# Patient Record
Sex: Male | Born: 1990 | Race: Black or African American | Hispanic: No | Marital: Single | Smoking: Never smoker
Health system: Southern US, Community
[De-identification: ages and names within clinical notes are randomized; demographics above are authoritative.]

---
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
## 2011-07-14 LAB — DIFFERENTIAL
Basophils Absolute: 0.1
Eosinophils Absolute: 0.2
Eosinophils Relative: 4
Lymphs Abs: 2
Monocytes Absolute: 0.7
Neutrophils Relative %: 37 — ABNORMAL LOW

## 2011-07-14 LAB — CBC
HCT: 40.1
Hemoglobin: 13.1
MCV: 69.7 — ABNORMAL LOW
Platelets: 292
RDW: 13.2

## 2011-07-14 LAB — COMPREHENSIVE METABOLIC PANEL
Albumin: 3.9
CO2: 29
Chloride: 100
Creatinine, Ser: 1.02
GFR calc Af Amer: >60
Glucose, Bld: 97
Sodium: 136

## 2011-07-14 LAB — OCCULT BLOOD X 1 CARD TO LAB, STOOL: Fecal Occult Bld: NEGATIVE

## 2011-07-14 LAB — URINALYSIS, ROUTINE W REFLEX MICROSCOPIC
Glucose, UA: NEGATIVE
Ketones, ur: NEGATIVE
Nitrite: NEGATIVE
Protein, ur: NEGATIVE

## 2011-12-16 ENCOUNTER — Emergency Department (HOSPITAL_COMMUNITY)
Admission: EM | Admit: 2011-12-16 | Discharge: 2011-12-16 | Disposition: A | Discharge status: Home Health | Payer: Self-pay | Attending: Emergency Medicine | Admitting: Emergency Medicine

## 2011-12-16 ENCOUNTER — Emergency Department (HOSPITAL_COMMUNITY): Payer: Self-pay

## 2011-12-16 ENCOUNTER — Encounter (HOSPITAL_COMMUNITY): Payer: Self-pay | Admitting: Adult Health

## 2011-12-16 DIAGNOSIS — M545 Low back pain, unspecified: Secondary | ICD-10-CM | POA: Insufficient documentation

## 2011-12-16 DIAGNOSIS — M25519 Pain in unspecified shoulder: Secondary | ICD-10-CM | POA: Insufficient documentation

## 2011-12-16 MED ORDER — TRAMADOL HCL 50 MG PO TABS: 50.0000 mg | ORAL_TABLET | Freq: Four times a day (QID) | ORAL | Status: AC | PRN

## 2011-12-16 NOTE — ED Provider Notes (Signed)
History     CSN: [id number]  Arrival date & time 12/16/11  1437   None     Chief Complaint  Patient presents with  . Shoulder Pain  . Back Pain    (Consider location/radiation/quality/duration/timing/severity/associated sxs/prior treatment) HPI  Pt presents to the ED with complaints of constant right shoulder for three weeks. 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 He normally lifts weights and has not been able to do so. The patient also complains of back pain that hurts when he stands up and sits down. Once he walks he is fine and this pain has not kept him from doing any activities of daily living or activities that he enjoys. He denies weakness, loss of sensation or urinary/bowel incontinence.   History reviewed. No pertinent past medical history.  History reviewed. No pertinent past surgical history.  History reviewed. No pertinent family history.  History  Substance Use Topics  . Smoking status: Never Smoker   . Smokeless tobacco: Not on file  . Alcohol Use: No      Review of Systems  All other systems reviewed and are negative.    Allergies  Review of patient's allergies indicates no known allergies.  Home Medications   Current Outpatient Rx  Name Route Sig Dispense Refill  . IBUPROFEN 200 MG PO TABS Oral Take 400 mg by mouth every 6 (six) hours as needed. pain    . TRAMADOL HCL 50 MG PO TABS Oral Take 1 tablet (50 mg total) by mouth every 6 (six) hours as needed for pain. 15 tablet 0    There were no vitals taken for this visit.  Physical Exam  Nursing note and vitals reviewed. Constitutional: He appears well-developed and well-nourished. No distress.  HENT:  Head: Normocephalic and atraumatic.  Eyes: Pupils are equal, round, and reactive to light.  Neck: Normal range of motion. Neck supple.  Cardiovascular: Normal rate and regular rhythm.   Pulmonary/Chest: Effort normal.  Abdominal: Soft.  Musculoskeletal:     Right shoulder: He exhibits tenderness. He exhibits normal range of motion, no bony tenderness, no swelling, no effusion, no crepitus, no deformity, no laceration, normal pulse and normal strength.       Lumbar back: He exhibits tenderness and spasm. He exhibits normal range of motion, no bony tenderness, no swelling, no edema, no deformity, no laceration, no pain and normal pulse.  Neurological: He is alert.  Skin: Skin is warm and dry.    ED Course  Procedures (including critical care time)  Labs Reviewed - No data to display Dg Shoulder Right  12/16/2011  *RADIOLOGY REPORT*  Clinical Data: 21 year old male with right shoulder pain.  No known injury.  RIGHT SHOULDER - 2+ VIEW  Comparison: None  Findings: There is no evidence of fracture or dislocation. On the internal rotation view, there appears to be mild inferior subluxation of the humeral head. No focal bony abnormalities are otherwise noted. The visualized right hemithorax is unremarkable.  IMPRESSION: Question mild inferior subluxation of the humeral head on internal rotation which could represent instability.  No other significant abnormalities identified.  Original Report Authenticated By: Rosendo Gros, M.D.     1. Shoulder pain   2. Low back pain       MDM  Pt xrays shows some mild findings that suggest possible instability. Will refer patient to Ortho and give Rx for Ultram for pain. I will also place patient in sling for shoulder  rest.        Dorthula Matas, PA 12/16/11 1550

## 2011-12-16 NOTE — Discharge Instructions (Signed)
Acromioclavicular Injuries  The AC (acromioclavicular) joint is the joint in the shoulder where the collarbone (clavicle) meets the shoulder blade (scapula). The part of the shoulder blade connected to the collarbone is called the acromion. Common problems with and treatments for the AC joint are detailed below.  ARTHRITIS  Arthritis occurs when the joint has been injured and the smooth padding between the joints (cartilage) is lost. This is the wear and tear seen in most joints of the body if they have been overused. This causes the joint to produce pain and swelling which is worse with activity.   AC JOINT SEPARATION  AC joint separation means that the ligaments connecting the acromion of the shoulder blade and collarbone have been damaged, and the two bones no longer line up. AC separations can be anywhere from mild to severe, and are "graded" depending upon which ligaments are torn and how badly they are torn.   Grade I Injury: the least damage is done, and the AC joint still lines up.   Grade II Injury: damage to the ligaments which reinforce the AC joint. In a Grade II injury, these ligaments are stretched but not entirely torn. When stressed, the AC joint becomes painful and unstable.   Grade III Injury: AC and secondary ligaments are completely torn, and the collarbone is no longer attached to the shoulder blade. This results in deformity; a prominence of the end of the clavicle.  AC JOINT FRACTURE  AC joint fracture means that there has been a break in the bones of the AC joint, usually the end of the clavicle.  TREATMENT  TREATMENT OF AC ARTHRITIS   There is currently no way to replace the cartilage damaged by arthritis. The best way to improve the condition is to decrease the activities which aggravate the problem. Application of ice to the joint helps decrease pain and soreness (inflammation). The use of non-steroidal anti-inflammatory medication is helpful.   If less conservative measures do not  work, then cortisone shots (injections) may be used. These are anti-inflammatories; they decrease the soreness in the joint and swelling.   If non-surgical measures fail, surgery may be recommended. The procedure is generally removal of a portion of the end of the clavicle. This is the part of the collarbone closest to your acromion which is stabilized with ligaments to the acromion of the shoulder blade. This surgery may be performed using a tube-like instrument with a light (arthroscope) for looking into a joint. It may also be performed as an open surgery through a small incision by the surgeon. Most patients will have good range of motion within 6 weeks and may return to all activity including sports by 8-12 weeks, barring complications.  TREATMENT OF AN AC SEPARATION   The initial treatment is to decrease pain. This is best accomplished by immobilizing the arm in a sling and placing an ice pack to the shoulder for 20 to 30 minutes every 2 hours as needed. As the pain starts to subside, it is important to begin moving the fingers, wrist, elbow and eventually the shoulder in order to prevent a stiff or "frozen" shoulder. Instruction on when and how much to move the shoulder will be provided by your caregiver. The length of time needed to regain full motion and function depends on the amount or grade of the injury. Recovery from a Grade I AC separation usually takes 10 to 14 days, whereas a Grade III may take 6 to 8 weeks.   Grade   III injuries usually allow return to full activity with few restrictions. Treatment is also based on the activity demands of the injured shoulder. For example, a high level quarterback with an injured throwing arm will receive more aggressive treatment than someone with a desk job who rarely uses his/her arm for strenuous activities. In some cases, a painful lump may persist which could require a later surgery.  Surgery can be very successful, but the benefits must be weighed against the potential risks.  TREATMENT OF AN AC JOINT FRACTURE Fracture treatment depends on the type of fracture. Sometimes a splint or sling may be all that is required. Other times surgery may be required for repair. This is more frequently the case when the ligaments supporting the clavicle are completely torn. Your caregiver will help you with these decisions and together you can decide what will be the best treatment. HOME CARE INSTRUCTIONS   Apply ice to the injury for 15 to 20 minutes each hour while awake for 2 days. Put the ice in a plastic bag and place a towel between the bag of ice and skin.   If a sling has been applied, wear it constantly for as long as directed by your caregiver, even at night. The sling or splint can be removed for bathing or showering or as directed. Be sure to keep the shoulder in the same place as when the sling is on. Do not lift the arm.   If a figure-of-eight splint has been applied it should be tightened gently by another person every day. Tighten it enough to keep the shoulders held back. Allow enough room to place the index finger between the body and strap. Loosen the splint immediately if there is numbness or tingling in the hands.   Take over-the-counter or prescription medicines for pain, discomfort or fever as directed by your caregiver.   If you or your child has received a follow up appointment, it is very important to keep that appointment in order to avoid long term complications, chronic pain or disability.  SEEK MEDICAL CARE IF:   The pain is not relieved with medications.   There is increased swelling or discoloration that continues to get worse rather than better.   You or your child has been unable to follow up as instructed.   There is progressive numbness and tingling in the arm, forearm or hand.  SEEK IMMEDIATE MEDICAL CARE IF:   The arm is numb, cold or pale.    There is increasing pain in the hand, forearm or fingers.  MAKE SURE YOU:   Understand these instructions.   Will watch your condition.   Will get help right away if you are not doing well or get worse.  Document Released: 07/19/2005 Document Revised: 06/21/2011 Document Reviewed: 01/11/2009 [facility] Patient Information 2012 [place], [place].Back Exercises Back exercises help treat and prevent back injuries. The goal of back exercises is to increase the strength of your abdominal and back muscles and the flexibility of your back. These exercises should be started when you no longer have back pain. Back exercises include:  Pelvic Tilt. Lie on your back with your knees bent. Tilt your pelvis until the lower part of your back is against the floor. Hold this position 5 to 10 sec and repeat 5 to 10 times.   Knee to Chest. Pull first 1 knee up against your chest and hold for 20 to 30 seconds, repeat this with the other knee, and then both knees. This may  be done with the other leg straight or bent, whichever feels better.   Sit-Ups or Curl-Ups. Bend your knees 90 degrees. Start with tilting your pelvis, and do a partial, slow sit-up, lifting your trunk only 30 to 45 degrees off the floor. Take at least 2 to 3 seconds for each sit-up. Do not do sit-ups with your knees out straight. If partial sit-ups are difficult, simply do the above but with only tightening your abdominal muscles and holding it as directed.   Hip-Lift. Lie on your back with your knees flexed 90 degrees. Push down with your feet and shoulders as you raise your hips a couple inches off the floor; hold for 10 seconds, repeat 5 to 10 times.   Back arches. Lie on your stomach, propping yourself up on bent elbows. Slowly press on your hands, causing an arch in your low back. Repeat 3 to 5 times. Any initial stiffness and discomfort should lessen with repetition over time.   Shoulder-Lifts. Lie face down with arms beside your body. Keep  hips and torso pressed to floor as you slowly lift your head and shoulders off the floor.  Do not overdo your exercises, especially in the beginning. Exercises may cause you some mild back discomfort which lasts for a few minutes; however, if the pain is more severe, or lasts for more than 15 minutes, do not continue exercises until you see your caregiver. Improvement with exercise therapy for back problems is slow.  See your caregivers for assistance with developing a proper back exercise program. Document Released: 11/16/2004 Document Revised: 06/07/2011 Document Reviewed: 10/09/2005 [facility] Patient Information 2012 [place], [place].Back Pain, Adult Low back pain is very common. About 1 in 5 people have back pain.The cause of low back pain is rarely dangerous. The pain often gets better over time.About half of people with a sudden onset of back pain feel better in just 2 weeks. About 8 in 10 people feel better by 6 weeks.  CAUSES Some common causes of back pain include:  Strain of the muscles or ligaments supporting the spine.   Wear and tear (degeneration) of the spinal discs.   Arthritis.   Direct injury to the back.  DIAGNOSIS Most of the time, the direct cause of low back pain is not known.However, back pain can be treated effectively even when the exact cause of the pain is unknown.Answering your caregiver's questions about your overall health and symptoms is one of the most accurate ways to make sure the cause of your pain is not dangerous. If your caregiver needs more information, he or she may order lab work or imaging tests (X-rays or MRIs).However, even if imaging tests show changes in your back, this usually does not require surgery. HOME CARE INSTRUCTIONS For many people, back pain returns.Since low back pain is rarely dangerous, it is often a condition that people can learn to [facility] their own.   Remain active. It is stressful on the back to sit or stand in one place. Do  not sit, drive, or stand in one place for more than 30 minutes at a time. Take short walks on level surfaces as soon as pain allows.Try to increase the length of time you walk each day.   Do not stay in bed.Resting more than 1 or 2 days can delay your recovery.   Do not avoid exercise or work.Your body is made to move.It is not dangerous to be active, even though your back may hurt.Your back will likely heal faster  if you return to being active before your pain is gone.   Pay attention to your body when you bend and lift. Many people have less discomfortwhen lifting if they bend their knees, keep the load close to their bodies,and avoid twisting. Often, the most comfortable positions are those that put less stress on your recovering back.   Find a comfortable position to sleep. Use a firm mattress and lie on your side with your knees slightly bent. If you lie on your back, put a pillow under your knees.   Only take over-the-counter or prescription medicines as directed by your caregiver. Over-the-counter medicines to reduce pain and inflammation are often the most helpful.Your caregiver may prescribe muscle relaxant drugs.These medicines help dull your pain so you can more quickly return to your normal activities and healthy exercise.   Put ice on the injured area.   Put ice in a plastic bag.   Place a towel between your skin and the bag.   Leave the ice on for 15 to 20 minutes, 3 to 4 times a day for the first 2 to 3 days. After that, ice and heat may be alternated to reduce pain and spasms.   Ask your caregiver about trying back exercises and gentle massage. This may be of some benefit.   Avoid feeling anxious or stressed.Stress increases muscle tension and can worsen back pain.It is important to recognize when you are anxious or stressed and learn ways to manage it.Exercise is a great option.  SEEK MEDICAL CARE IF:  You have pain that is not relieved with rest or medicine.     You have pain that does not improve in 1 week.   You have new symptoms.   You are generally not feeling well.  SEEK IMMEDIATE MEDICAL CARE IF:   You have pain that radiates from your back into your legs.   You develop new bowel or bladder control problems.   You have unusual weakness or numbness in your arms or legs.   You develop nausea or vomiting.   You develop abdominal pain.   You feel faint.  Document Released: 10/09/2005 Document Revised: 06/21/2011 Document Reviewed: 02/27/2011 [facility] Patient Information 2012 [place], [place].

## 2011-12-16 NOTE — ED Notes (Signed)
C/o one monjth of constant right shoulder pain and 3 weeks of lower back pain that is worse with standing and sitting.

## 2011-12-16 NOTE — ED Provider Notes (Signed)
Medical screening examination/treatment/procedure(s) were performed by non-physician practitioner and as supervising physician I was immediately available for consultation/collaboration.  Ciin Brazzel R. Tahj Lindseth, MD 12/16/11 1613 

## 2012-05-21 ENCOUNTER — Emergency Department (HOSPITAL_COMMUNITY)
Admission: EM | Admit: 2012-05-21 | Discharge: 2012-05-21 | Disposition: A | Discharge status: Home / Self Care | Payer: Self-pay | Attending: Emergency Medicine | Admitting: Emergency Medicine

## 2012-05-21 ENCOUNTER — Encounter (HOSPITAL_COMMUNITY): Payer: Self-pay | Admitting: Emergency Medicine

## 2012-05-21 ENCOUNTER — Emergency Department (HOSPITAL_COMMUNITY): Payer: Self-pay

## 2012-05-21 DIAGNOSIS — S4980XA Other specified injuries of shoulder and upper arm, unspecified arm, initial encounter: Secondary | ICD-10-CM | POA: Insufficient documentation

## 2012-05-21 DIAGNOSIS — S4992XA Unspecified injury of left shoulder and upper arm, initial encounter: Secondary | ICD-10-CM

## 2012-05-21 DIAGNOSIS — W19XXXA Unspecified fall, initial encounter: Secondary | ICD-10-CM | POA: Insufficient documentation

## 2012-05-21 DIAGNOSIS — Y9361 Activity, american tackle football: Secondary | ICD-10-CM | POA: Insufficient documentation

## 2012-05-21 DIAGNOSIS — S46909A Unspecified injury of unspecified muscle, fascia and tendon at shoulder and upper arm level, unspecified arm, initial encounter: Secondary | ICD-10-CM | POA: Insufficient documentation

## 2012-05-21 MED ORDER — MORPHINE SULFATE 4 MG/ML IJ SOLN
4.0000 mg | Freq: Once | INTRAMUSCULAR | Status: AC
  Administered 2012-05-21: 4 mg via INTRAVENOUS
  Filled 2012-05-21: qty 1

## 2012-05-21 MED ORDER — SODIUM CHLORIDE 0.9 % IV SOLN
Freq: Once | INTRAVENOUS | Status: AC
  Administered 2012-05-21: 14:00:00 via INTRAVENOUS

## 2012-05-21 MED ORDER — OXYCODONE-ACETAMINOPHEN 5-325 MG PO TABS: 1.0000 | ORAL_TABLET | Freq: Four times a day (QID) | ORAL | Status: AC | PRN

## 2012-05-21 MED ORDER — HYDROMORPHONE HCL PF 1 MG/ML IJ SOLN
1.0000 mg | Freq: Once | INTRAMUSCULAR | Status: AC
  Administered 2012-05-21: 1 mg via INTRAVENOUS
  Filled 2012-05-21: qty 1

## 2012-05-21 MED ORDER — OXYCODONE-ACETAMINOPHEN 5-325 MG PO TABS
2.0000 | ORAL_TABLET | Freq: Once | ORAL | Status: AC
  Administered 2012-05-21: 2 via ORAL
  Filled 2012-05-21: qty 2

## 2012-05-21 NOTE — ED Notes (Signed)
Pt c/o of left should injury after playing football today. States that left should is painful and tender. Pain is 10/10

## 2012-05-21 NOTE — ED Notes (Signed)
Patient transported to X-ray 

## 2012-05-21 NOTE — Progress Notes (Signed)
CM spoke with pt who confirms self pay [facility] resident with no pcp. Discussed the importance of a pcp for f/u. Reviewed Health connect number to assist with finding self pay provider close to pt's residence. Reviewed resources for [facility], general medial clinics, medications-[email or web address], housing, DSS, health Department and other resources in [employer] including crisis programs Pt voiced understanding and appreciation of resources provided

## 2012-05-21 NOTE — ED Provider Notes (Signed)
History     CSN: [id number]  Arrival date & time 05/21/12  1305   First MD Initiated Contact with Patient 05/21/12 1404      Chief Complaint  Patient presents with  . LT shoulder injury     (Consider location/radiation/quality/duration/timing/severity/associated sxs/prior treatment) The history is provided by the patient.  Healthy 21 y/o M presents with c/c left shoulder injury just PTA today. Was playing football, fell to the ground on the shoulder. Has been unable to perform much movement since that time. Pain severe, constant, worse with attempted movement or palpation of the area. Non-radiating. Denies wound, distal numbness or weakness. No prior tx.  History reviewed. No pertinent past medical history.  History reviewed. No pertinent past surgical history.  No family history on file.  History  Substance Use Topics  . Smoking status: Never Smoker   . Smokeless tobacco: Not on file  . Alcohol Use: No      Review of Systems 10 systems reviewed and are negative for acute change except as noted in the HPI.  Allergies  Review of patient's allergies indicates no known allergies.  Home Medications  No current outpatient prescriptions on file.  BP 122/79  Pulse 65  Temp 99 F (37.2 C) (Oral)  Resp 18  Wt 173 lb 9.6 oz (78.744 kg)  SpO2 100%  Physical Exam  Nursing note and vitals reviewed. Constitutional: He appears well-developed and well-nourished.       Uncomfortable appearing.  HENT:  Head: Normocephalic and atraumatic.  Right Ear: External ear normal.  Left Ear: External ear normal.  Eyes: Conjunctivae are normal.  Neck: Normal range of motion. Neck supple.  Cardiovascular: Normal rate and regular rhythm.        Bilateral radial pulses 2+  Pulmonary/Chest: Effort normal. No respiratory distress.  Abdominal: He exhibits no distension.  Musculoskeletal: He exhibits tenderness. He exhibits no edema.       Left shoulder: He exhibits decreased range of  motion, tenderness, pain and spasm. He exhibits normal pulse.       Left elbow: Normal.       Left wrist: Normal.       Arms:      Left hand: Normal.  Neurological: He is alert.       Sensation intact to light touch in BUE. Bilateral grip strength 5/5.  Skin: Skin is warm and dry.       Abrasion over lateral aspect of left shoulder, no bleeding.    ED Course  Procedures (including critical care time)  Labs Reviewed - No data to display Dg Shoulder Left  05/21/2012  *RADIOLOGY REPORT*  Clinical Data: Shoulder injury  LEFT SHOULDER - 2+ VIEW  Comparison: None.  Findings: No acute fracture.  No dislocation.  The scapula is in a somewhat anterior position.  IMPRESSION: No acute bony pathology.  Original Report Authenticated By: Donavan Burnet, M.D.   Dg Shoulder Left  05/21/2012  *RADIOLOGY REPORT*  Clinical Data: Pain.  Visible deformity.  LEFT SHOULDER - 2+ VIEW  Comparison: None.  Findings: The images are somewhat suboptimal due to the patient's position. There is no obvious dislocation or fracture.  IMPRESSION: Study is limited by patient positioning.  No obvious fracture or dislocation.  Repeat study with better positioning is recommended once the patient is capable.  Original Report Authenticated By: Donavan Burnet, M.D.    Dx 1: left shoulder injury   MDM  Left shoulder injury. Imaging x 2 given poor positioning  for initial study. No fracture or dislocation identified. Pt has sig difficulty moving arm secondary to pain. Pt is discussed with attending MD. Rotator cuff injury is considered, discussed with pt, ortho f/u recommended. Distal NVI at initial examination and just prior to discharge. Sling placed. Pain rx given.        Shaaron Adler, [place] 05/21/12 1737

## 2012-05-21 NOTE — Progress Notes (Signed)
Male family friend given information and voiced understanding and appreciation of services/resources offered

## 2012-05-21 NOTE — ED Notes (Signed)
[id number]<AV> Expected date:05/21/12<BR> Expected time: 1:32 PM<BR> Means of arrival:<BR> Comments:<BR> Triage 1

## 2012-05-21 NOTE — ED Notes (Signed)
Ortho tech paged  

## 2012-05-25 NOTE — ED Provider Notes (Signed)
Medical screening examination/treatment/procedure(s) were performed by non-physician practitioner and as supervising physician I was immediately available for consultation/collaboration.    Nelia Shi, MD 05/25/12 [phone]

## 2013-04-26 ENCOUNTER — Encounter (HOSPITAL_COMMUNITY): Payer: Self-pay

## 2013-04-26 ENCOUNTER — Emergency Department (HOSPITAL_COMMUNITY)
Admission: EM | Admit: 2013-04-26 | Discharge: 2013-04-26 | Disposition: A | Discharge status: Home / Self Care | Payer: BC Managed Care – PPO | Attending: Emergency Medicine | Admitting: Emergency Medicine

## 2013-04-26 DIAGNOSIS — R259 Unspecified abnormal involuntary movements: Secondary | ICD-10-CM | POA: Insufficient documentation

## 2013-04-26 DIAGNOSIS — F411 Generalized anxiety disorder: Secondary | ICD-10-CM | POA: Insufficient documentation

## 2013-04-26 DIAGNOSIS — M62838 Other muscle spasm: Secondary | ICD-10-CM | POA: Insufficient documentation

## 2013-04-26 DIAGNOSIS — R251 Tremor, unspecified: Secondary | ICD-10-CM

## 2013-04-26 DIAGNOSIS — Z79899 Other long term (current) drug therapy: Secondary | ICD-10-CM | POA: Insufficient documentation

## 2013-04-26 LAB — COMPREHENSIVE METABOLIC PANEL
AST: 18 U/L (ref 0–37)
BUN: 12 mg/dL (ref 6–23)
Calcium: 9.3 mg/dL (ref 8.4–10.5)
Chloride: 103 mEq/L (ref 96–112)
GFR calc Af Amer: >90 mL/min (ref >90)
GFR calc non Af Amer: >90 mL/min (ref >90)
Sodium: 138 mEq/L (ref 135–145)
Total Bilirubin: 1.2 mg/dL (ref 0.3–1.2)

## 2013-04-26 LAB — RAPID URINE DRUG SCREEN, HOSP PERFORMED
Barbiturates: NOT DETECTED
Tetrahydrocannabinol: NOT DETECTED

## 2013-04-26 MED ORDER — LORAZEPAM 1 MG PO TABS: 1.0000 mg | ORAL_TABLET | Freq: Three times a day (TID) | ORAL | Status: DC | PRN

## 2013-04-26 MED ORDER — LORAZEPAM 1 MG PO TABS
1.0000 mg | ORAL_TABLET | Freq: Once | ORAL | Status: AC
  Administered 2013-04-26: 1 mg via ORAL
  Filled 2013-04-26: qty 1

## 2013-04-26 NOTE — ED Notes (Signed)
MD at bedside. 

## 2013-04-26 NOTE — ED Notes (Signed)
He c/o involuntary tremors and "muscle spasms" of left upper extremity.  He is tearful and upset.  He is here with his parents and girlfriend.  His skin is normal, warm and dry and he is breathing normally.

## 2013-04-26 NOTE — ED Provider Notes (Addendum)
History    CSN: [id number] Arrival date & time 04/26/13  1028  First MD Initiated Contact with Patient 04/26/13 1033     Chief Complaint  Patient presents with  . Tremors   (Consider location/radiation/quality/duration/timing/severity/associated sxs/prior Treatment) HPI   patient developed tremors and "muscle spasms" of both arms and face and possibly both legs several days ago, becoming worse this morning. He's had similar tremors of his jaws for approximately 2 years, however has never sought medical care. He denies pain anywhere. He admits to anxiety. He denies heavy caffeine use. Denies other complaint. No treatment prior to coming here. Nothing makes symptoms better or worse.  History reviewed. No pertinent past medical history. History reviewed. No pertinent past surgical history. History reviewed. No pertinent family history. History  Substance Use Topics  . Smoking status: Never Smoker   . Smokeless tobacco: Not on file  . Alcohol Use: No    Review of Systems  Constitutional: Negative.   HENT: Negative.   Respiratory: Negative.   Cardiovascular: Negative.   Gastrointestinal: Negative.   Musculoskeletal: Negative.   Skin: Negative.   Neurological: Positive for tremors.  Psychiatric/Behavioral: Negative.        Anxiety  All other systems reviewed and are negative.    Allergies  Review of patient's allergies indicates no known allergies.  Home Medications   Current Outpatient Rx  Name  Route  Sig  Dispense  Refill  . OVER THE COUNTER MEDICATION      1 tablet 4 (four) times daily. HYDROXYCUT-WEIGHT LOSS SUPPLEMENT          BP 143/92  Pulse 104  Temp(Src) 98.5 F (36.9 C) (Oral)  Resp 20  Ht 5\' 7"  (1.702 m)  Wt 195 lb (88.451 kg)  BMI 30.53 kg/m2  SpO2 100% Physical Exam  Nursing note and vitals reviewed. Constitutional: He appears well-developed and well-nourished.  HENT:  Head: Normocephalic and atraumatic.  Eyes: Conjunctivae are normal. Pupils  are equal, round, and reactive to light.  Neck: Neck supple. No tracheal deviation present. No thyromegaly present.  Cardiovascular: Normal rate and regular rhythm.   No murmur heard. Pulmonary/Chest: Effort normal and breath sounds normal.  Abdominal: Soft. Bowel sounds are normal. He exhibits no distension. There is no tenderness.  Musculoskeletal: Normal range of motion. He exhibits no edema and no tenderness.  Neurological: He is alert. He has normal reflexes. He displays normal reflexes. Coordination normal.  Tremor of left hand at rest which extinguishes when patient is distracted. Finger to nose normal heel to shin normal gait normal Romberg normal tremor of left hand worsens when patient performs finger to nose. Motor strength 5 over 5 overall  Skin: Skin is warm and dry. No rash noted.  Psychiatric: He has a normal mood and affect.    ED Course  Procedures (including critical care time) Labs Reviewed - No data to display No results found. No diagnosis found. 1:30 PM patient feels much improved after treatment Ativan. He no longer has tremors. Results for orders placed during the hospital encounter of 04/26/13  COMPREHENSIVE METABOLIC PANEL      Result Value Range   Sodium 138  135 - 145 mEq/L   Potassium 3.9  3.5 - 5.1 mEq/L   Chloride 103  96 - 112 mEq/L   CO2 30  19 - 32 mEq/L   Glucose, Bld 94  70 - 99 mg/dL   BUN 12  6 - 23 mg/dL   Creatinine, Ser 4.09  0.50 -  1.35 mg/dL   Calcium 9.3  8.4 - 16.1 mg/dL   Total Protein 6.5  6.0 - 8.3 g/dL   Albumin 3.6  3.5 - 5.2 g/dL   AST 18  0 - 37 U/L   ALT 15  0 - 53 U/L   Alkaline Phosphatase 56  39 - 117 U/L   Total Bilirubin 1.2  0.3 - 1.2 mg/dL   GFR calc non Af Amer >90  >90 mL/min   GFR calc Af Amer >90  >90 mL/min  URINE RAPID DRUG SCREEN (HOSP PERFORMED)      Result Value Range   Opiates NONE DETECTED  NONE DETECTED   Cocaine NONE DETECTED  NONE DETECTED   Benzodiazepines NONE DETECTED  NONE DETECTED   Amphetamines  NONE DETECTED  NONE DETECTED   Tetrahydrocannabinol NONE DETECTED  NONE DETECTED   Barbiturates NONE DETECTED  NONE DETECTED   No results found.  MDM  I feel that patient's symptoms are likely secondary to anxiety. Patient told to stop hydroxy cut. He says he hasn't had any in 4 days Plan prescription Ativan. Referral to illness center to get primary care physician Diagnosis #1 tremor #2 anxiety  Doug Sou, MD 04/26/13 1336  Doug Sou, MD 04/26/13 1353

## 2013-05-01 ENCOUNTER — Ambulatory Visit: Payer: BC Managed Care – PPO | Attending: Family Medicine | Admitting: Internal Medicine

## 2013-05-01 VITALS — BP 134/69 | HR 80 | Temp 97.0°F | Resp 17 | Ht 67.0 in | Wt 201.2 lb

## 2013-05-01 DIAGNOSIS — M549 Dorsalgia, unspecified: Secondary | ICD-10-CM

## 2013-05-01 DIAGNOSIS — F411 Generalized anxiety disorder: Secondary | ICD-10-CM | POA: Insufficient documentation

## 2013-05-01 DIAGNOSIS — G8929 Other chronic pain: Secondary | ICD-10-CM

## 2013-05-01 MED ORDER — ACETAMINOPHEN 325 MG PO TABS: 325.0000 mg | ORAL_TABLET | Freq: Four times a day (QID) | ORAL | Status: DC | PRN

## 2013-05-01 NOTE — Addendum Note (Signed)
Addended by: Susa Raring K on: 05/01/2013 12:40 PM   Modules accepted: Orders

## 2013-05-01 NOTE — Progress Notes (Deleted)
Patient ID: Eddie Rose, male   DOB: Feb 21, 1991, 22 y.o.   MRN: [id number]  Patient Demographics  Eddie Rose, is a 22 y.o. male  ZDG:[id number]  [id number]  DOB - 1991/05/20  Chief Complaint  Patient presents with  . Hospitalization Follow-up        Subjective:   Eddie Rose with History of dilation of chronic low back pain which has been present for 5 or 6 years after a work-related injury, he recently went to the ER for anxiety and tremors and was prescribed benzodiazepines, he was here a few weeks ago and had an T spine x-ray he is wants to obtain the results, he denies any bowel bladder incontinence, no fever chills, no paresthesias or weakness in legs.  Denies any subjective complaints except as above, no active headache, no chest abdominal pain at this time, not short of breath. No focal weakness which is new.   Objective:    Patient Active Problem List   Diagnosis Date Noted  . Chronic back pain 05/01/2013     Filed Vitals:   05/01/13 1212  BP: 134/69  Pulse: 80  Temp: 97 F (36.1 C)  Resp: 17  Height: 5\' 7"  (1.702 m)  Weight: 201 lb 3.2 oz (91.264 kg)  SpO2: 99%     Exam  Awake Alert, Oriented X 3, No new F.N deficits, Normal affect [place].AT,PERRAL Supple Neck,No JVD, No cervical lymphadenopathy appriciated.  Symmetrical Chest wall movement, Good air movement bilaterally, CTAB RRR,No Gallops,Rubs or new Murmurs, No Parasternal Heave +ve B.Sounds, Abd Soft, Non tender, No organomegaly appriciated, No rebound - guarding or rigidity. No Cyanosis, Clubbing or edema, No new Rash or bruise Back no tenderness on palpation ear L-spine, straight leg raise test negative both legs, range of motion is limited on bending forward he says he's having pain in low back.    Data Review   CBC No results found for this basename: WBC, HGB, HCT, PLT, MCV, MCH, MCHC, RDW, NEUTRABS, LYMPHSABS, MONOABS, EOSABS, BASOSABS, BANDABS, BANDSABD,  in the last 168  hours  Chemistries    Recent Labs Lab 04/26/13 1215  NA 138  K 3.9  CL 103  CO2 30  GLUCOSE 94  BUN 12  CREATININE 1.01  CALCIUM 9.3  AST 18  ALT 15  ALKPHOS 56  BILITOT 1.2   ------------------------------------------------------------------------------------------------------------------ No results found for this basename: HGBA1C,  in the last 72 hours ------------------------------------------------------------------------------------------------------------------ No results found for this basename: CHOL, HDL, LDLCALC, TRIG, CHOLHDL, LDLDIRECT,  in the last 72 hours ------------------------------------------------------------------------------------------------------------------ No results found for this basename: TSH, T4TOTAL, FREET3, T3FREE, THYROIDAB,  in the last 72 hours ------------------------------------------------------------------------------------------------------------------ No results found for this basename: VITAMINB12, FOLATE, FERRITIN, TIBC, IRON, RETICCTPCT,  in the last 72 hours  Coagulation profile  No results found for this basename: INR, PROTIME,  in the last 168 hours     Prior to Admission medications   Medication Sig Start Date End Date Taking? Authorizing Provider  acetaminophen (TYLENOL) 325 MG tablet Take 1 tablet (325 mg total) by mouth every 6 (six) hours as needed for pain. 05/01/13   Leroy Sea, MD  LORazepam (ATIVAN) 1 MG tablet Take 1 tablet (1 mg total) by mouth 3 (three) times daily as needed for anxiety. 04/26/13   Doug Sou, MD  OVER THE COUNTER MEDICATION 1 tablet 4 (four) times daily. HYDROXYCUT-WEIGHT LOSS SUPPLEMENT    Historical Provider, MD     Assessment & Plan   Chronic back pain from injury which  he incurred about 5-6 years ago. X-rays reviewed with old findings in the T-spine area, patient says he does not want to see neurosurgeon he was offered back surgery after his initial injury but he refused that. He says  he cannot take NSAIDs as they clear stomach, he will be prescribed Tylenol for pain control note this pain is there for 5-6 years and is unchanged. I have referred him to pain clinic.         Leroy Sea M.D on 05/01/2013 at 12:24 PM

## 2013-05-01 NOTE — Addendum Note (Signed)
Addended by: Leroy Sea on: 05/01/2013 12:34 PM   Modules accepted: Level of Service

## 2013-05-01 NOTE — Addendum Note (Signed)
Addended by: Leroy Sea on: 05/01/2013 12:28 PM   Modules accepted: Orders, Medications

## 2013-05-01 NOTE — Addendum Note (Signed)
Addended by: Leroy Sea on: 05/01/2013 12:43 PM   Modules accepted: Level of Service

## 2013-05-01 NOTE — Progress Notes (Signed)
Patient ID: Eddie Rose, male   DOB: May 02, 1991, 22 y.o.   MRN: [id number]  Patient Demographics  Eddie Rose, is a 22 y.o. male  CSN: [id number]  MRN: [id number]  DOB - 08/27/1991  Outpatient Primary MD for the patient is No primary provider on file.   With History of -  History reviewed. No pertinent past medical history.    History reviewed. No pertinent past surgical history.  in for   Chief Complaint  Patient presents with  . Hospitalization Follow-up     HPI  Eddie Rose  is a 22 y.o. male, no past medical or surgical issues who was recently seen in the ER after some personal stress at work after which she got little anxious, he has been given a course of benzodiazepine for the last 4 days with good effect he is completely symptom free now. No subjective complaints.    Review of Systems    In addition to the HPI above,  No Fever-chills, No Headache, No changes with Vision or hearing, No problems swallowing food or Liquids, No Chest pain, Cough or Shortness of Breath, No Abdominal pain, No Nausea or Vommitting, Bowel movements are regular, No Blood in stool or Urine, No dysuria, No new skin rashes or bruises, No new joints pains-aches,  No new weakness, tingling, numbness in any extremity, No recent weight gain or loss, No polyuria, polydypsia or polyphagia, No significant Mental Stressors.  A full 10 point Review of Systems was done, except as stated above, all other Review of Systems were negative.   Social History History  Substance Use Topics  . Smoking status: Never Smoker   . Smokeless tobacco: Not on file  . Alcohol Use: No     Family History Diabetes mellitus in both parents  Prior to Admission medications   Medication Sig Start Date End Date Taking? Authorizing Provider  LORazepam (ATIVAN) 1 MG tablet Take 1 tablet (1 mg total) by mouth 3 (three) times daily as needed for anxiety. 04/26/13   Doug Sou, MD  OVER THE COUNTER  MEDICATION 1 tablet 4 (four) times daily. HYDROXYCUT-WEIGHT LOSS SUPPLEMENT    Historical Provider, MD    No Known Allergies  Physical Exam  Vitals  Blood pressure 134/69, pulse 80, temperature 97 F (36.1 C), resp. rate 17, height 5\' 7"  (1.702 m), weight 201 lb 3.2 oz (91.264 kg), SpO2 99.00%.   1. General Young African American male sitting on the examination table in NAD,    2. Normal affect and insight, Not Suicidal or Homicidal, Awake Alert, Oriented X 3.  3. No F.N deficits, ALL C.Nerves Intact, Strength 5/5 all 4 extremities, Sensation intact all 4 extremities, Plantars down going. No tremors.  4. Ears and Eyes appear Normal, Conjunctivae clear, PERRLA. Moist Oral Mucosa.  5. Supple Neck, No JVD, No cervical lymphadenopathy appriciated, No Carotid Bruits.  6. Symmetrical Chest wall movement, Good air movement bilaterally, CTAB.  7. RRR, No Gallops, Rubs or Murmurs, No Parasternal Heave.  8. Positive Bowel Sounds, Abdomen Soft, Non tender, No organomegaly appriciated,No rebound -guarding or rigidity.  9.  No Cyanosis, Normal Skin Turgor, No Skin Rash or Bruise.  10. Good muscle tone,  joints appear normal , no effusions, Normal ROM.  11. No Palpable Lymph Nodes in Neck or Axillae    Data Review  CBC No results found for this basename: WBC, HGB, HCT, PLT, MCV, MCH, MCHC, RDW, NEUTRABS, LYMPHSABS, MONOABS, EOSABS, BASOSABS, BANDABS, BANDSABD,  in the last 168 hours ------------------------------------------------------------------------------------------------------------------  Chemistries   Recent Labs Lab 04/26/13 1215  NA 138  K 3.9  CL 103  CO2 30  GLUCOSE 94  BUN 12  CREATININE 1.01  CALCIUM 9.3  AST 18  ALT 15  ALKPHOS 56  BILITOT 1.2   ------------------------------------------------------------------------------------------------------------------ estimated creatinine clearance is 121.6 ml/min (by C-G formula based on Cr of  1.01). ------------------------------------------------------------------------------------------------------------------ No results found for this basename: TSH, T4TOTAL, FREET3, T3FREE, THYROIDAB,  in the last 72 hours   Coagulation profile No results found for this basename: INR, PROTIME,  in the last 168 hours ------------------------------------------------------------------------------------------------------------------- No results found for this basename: DDIMER,  in the last 72 hours -------------------------------------------------------------------------------------------------------------------  Cardiac Enzymes No results found for this basename: CK, CKMB, TROPONINI, MYOGLOBIN,  in the last 168 hours ------------------------------------------------------------------------------------------------------------------ No components found with this basename: POCBNP,    ---------------------------------------------------------------------------------------------------------------  Urinalysis    Component Value Date/Time   COLORURINE YELLOW 11/27/2007 2140   APPEARANCEUR CLEAR 11/27/2007 2140   LABSPEC 1.022 11/27/2007 2140   PHURINE 6.5 11/27/2007 2140   GLUCOSEU NEGATIVE 11/27/2007 2140   HGBUR NEGATIVE 11/27/2007 2140   BILIRUBINUR NEGATIVE 11/27/2007 2140   KETONESUR NEGATIVE 11/27/2007 2140   PROTEINUR NEGATIVE 11/27/2007 2140   UROBILINOGEN 2.0* 11/27/2007 2140   NITRITE NEGATIVE 11/27/2007 2140   LEUKOCYTESUR NEGATIVE MICROSCOPIC NOT DONE ON URINES WITH NEGATIVE PROTEIN, BLOOD, LEUKOCYTES, NITRITE, OR GLUCOSE <1000 mg/dL. 11/27/2007 2140       Assessment and plan  1. Mild anxiety due to work related stress completely resolved I asked him to taper off benzodiazepines in a gradual fashion over the next one week, currently has no anxiety no suicidal or homicidal ideations.   We'll screen him for diabetes mellitus check A1c as he is strong family history of the same.   On his next visit  he should get a tetanus shot if he qualifies, currently neck is out of tetanus shot.  Leroy Sea M.D on 05/01/2013 at 12:41 PM

## 2013-05-01 NOTE — Progress Notes (Deleted)
Patient states was recently in the ED for spasm to his left Was prescribed ativan for anxiety which seems to help

## 2013-06-03 ENCOUNTER — Ambulatory Visit: Payer: BC Managed Care – PPO

## 2013-08-17 ENCOUNTER — Encounter (HOSPITAL_COMMUNITY): Payer: Self-pay | Admitting: Emergency Medicine

## 2013-08-17 ENCOUNTER — Emergency Department (HOSPITAL_COMMUNITY)
Admission: EM | Admit: 2013-08-17 | Discharge: 2013-08-17 | Disposition: A | Discharge status: Home / Self Care | Payer: BC Managed Care – PPO | Attending: Emergency Medicine | Admitting: Emergency Medicine

## 2013-08-17 ENCOUNTER — Emergency Department (HOSPITAL_COMMUNITY): Payer: BC Managed Care – PPO

## 2013-08-17 DIAGNOSIS — B353 Tinea pedis: Secondary | ICD-10-CM

## 2013-08-17 DIAGNOSIS — S93401A Sprain of unspecified ligament of right ankle, initial encounter: Secondary | ICD-10-CM

## 2013-08-17 DIAGNOSIS — W108XXA Fall (on) (from) other stairs and steps, initial encounter: Secondary | ICD-10-CM | POA: Insufficient documentation

## 2013-08-17 DIAGNOSIS — Y9301 Activity, walking, marching and hiking: Secondary | ICD-10-CM | POA: Insufficient documentation

## 2013-08-17 DIAGNOSIS — S93409A Sprain of unspecified ligament of unspecified ankle, initial encounter: Secondary | ICD-10-CM | POA: Insufficient documentation

## 2013-08-17 DIAGNOSIS — Y9289 Other specified places as the place of occurrence of the external cause: Secondary | ICD-10-CM | POA: Insufficient documentation

## 2013-08-17 MED ORDER — HYDROCODONE-ACETAMINOPHEN 5-325 MG PO TABS: 1.0000 | ORAL_TABLET | ORAL | Status: DC | PRN

## 2013-08-17 MED ORDER — ITRACONAZOLE 100 MG PO CAPS: 200.0000 mg | ORAL_CAPSULE | Freq: Two times a day (BID) | ORAL | Status: DC

## 2013-08-17 MED ORDER — IBUPROFEN 800 MG PO TABS: 800.0000 mg | ORAL_TABLET | Freq: Three times a day (TID) | ORAL | Status: DC

## 2013-08-17 NOTE — ED Provider Notes (Addendum)
TIME SEEN: 8:45 AM  CHIEF COMPLAINT: Right ankle pain  HPI: Patient is a 22 year old male with no significant past medical history who presents the emergency department with an injury to his right ankle that he sustained last night. He reports that he was leaving his house and walking down the stairs when he missed the last step. He in inverted his right ankle and fell to the ground. He reports he heard a crack. He was able to get up and walk afterwards but states this morning he has had increased pain with weightbearing. No swelling. No other injury. No head injury or loss of consciousness. No numbness, tingling or focal weakness. No fevers.  ROS: See HPI Constitutional: no fever  Eyes: no drainage  ENT: no runny nose   Cardiovascular:  no chest pain  Resp: no SOB  GI: no vomiting GU: no dysuria Integumentary: no rash  Allergy: no hives  Musculoskeletal: no leg swelling  Neurological: no slurred speech ROS otherwise negative  PAST MEDICAL HISTORY/PAST SURGICAL HISTORY:  History reviewed. No pertinent past medical history.  MEDICATIONS:  Prior to Admission medications   Medication Sig Start Date End Date Taking? Authorizing Provider  LORazepam (ATIVAN) 1 MG tablet Take 1 tablet (1 mg total) by mouth 3 (three) times daily as needed for anxiety. 04/26/13   Doug Sou, MD  OVER THE COUNTER MEDICATION 1 tablet 4 (four) times daily. HYDROXYCUT-WEIGHT LOSS SUPPLEMENT    Historical Provider, MD    ALLERGIES:  No Known Allergies  SOCIAL HISTORY:  History  Substance Use Topics  . Smoking status: Never Smoker   . Smokeless tobacco: Not on file  . Alcohol Use: No    FAMILY HISTORY: No family history on file.  EXAM: CONSTITUTIONAL: Alert and oriented and responds appropriately to questions. Well-appearing; well-nourished; GCS 15 HEAD: Normocephalic; atraumatic EYES: Conjunctivae clear, PERRL, EOMI ENT: normal nose; no rhinorrhea; moist mucous membranes; pharynx without lesions  noted; no dental injury;  no septal hematoma NECK: Supple, no meningismus, no LAD; no midline spinal tenderness, step-off or deformity CARD: RRR; S1 and S2 appreciated; no murmurs, no clicks, no rubs, no gallops RESP: Normal chest excursion without splinting or tachypnea; breath sounds clear and equal bilaterally; no wheezes, no rhonchi, no rales; chest wall stable, nontender to palpation ABD/GI: Normal bowel sounds; non-distended; soft, non-tender, no rebound, no guarding PELVIS:  stable, nontender to palpation BACK:  The back appears normal and is non-tender to palpation, there is no CVA tenderness; no midline spinal tenderness, step-off or deformity EXT: Patient has mild tenderness to palpation over the lateral malleolus and pain with inversion of his right foot, no swelling, no joint effusion, 2+ DP pulses bilaterally, sensation to light touch intact diffusely, patient is able to dorsiflex and plantar flex his right foot without pain, no tenderness to palpation over the proximal fibula, full range of motion in the right knee and right hip, no tenderness over the foot or heel, Thompson's test intact, no ligamentous laxity on exam; no erythema, warmth, induration; otherwise Normal ROM in all joints; non-tender to palpation; no edema; normal capillary refill; no cyanosis    SKIN: Normal color for age and race; warm; dry and flaky skin on soles of bilateral feet NEURO: Moves all extremities equally PSYCH: The patient's mood and manner are appropriate. Grooming and personal hygiene are appropriate.  MEDICAL DECISION MAKING: Patient with right ankle injury. Suspect sprain. Will obtain x-rays. Patient denies wanting pain medication. No other injury on exam.  Pt also with  tinea pedis that has been present for several weeks.  No sign of superimposed infection. Patient is asking for an antifungal cream.  Will dc on itraconazole x 1 week.    ED PROGRESS: X-rays are negative for acute fracture, dislocation.  Given patient has some pain with inversion of his foot, will place an air splint and given crutches for comfort. Will give or followup as needed. Given return precautions. Patient verbalizes understanding is comfortable with plan.     Layla Maw Ozell Ferrera, DO 08/17/13 0901  Layla Maw Jannell Franta, DO 08/17/13 1610

## 2013-08-17 NOTE — ED Notes (Signed)
Fell down steps last night and has pain to rt ankle with some swelling.

## 2013-08-17 NOTE — ED Notes (Signed)
Pt reports that he mis-stepped while walking down steps at his home "twisting" his R ankle. Pt is A&O and in NAD. No deformity noted

## 2013-08-28 ENCOUNTER — Encounter (HOSPITAL_COMMUNITY): Payer: Self-pay | Admitting: Emergency Medicine

## 2013-08-28 ENCOUNTER — Emergency Department (HOSPITAL_COMMUNITY)
Admission: EM | Admit: 2013-08-28 | Discharge: 2013-08-28 | Disposition: A | Discharge status: Home / Self Care | Payer: BC Managed Care – PPO | Attending: Emergency Medicine | Admitting: Emergency Medicine

## 2013-08-28 DIAGNOSIS — J029 Acute pharyngitis, unspecified: Secondary | ICD-10-CM | POA: Insufficient documentation

## 2013-08-28 MED ORDER — DIPHENHYDRAMINE HCL 12.5 MG/5ML PO ELIX
25.0000 mg | ORAL_SOLUTION | Freq: Once | ORAL | Status: AC
  Administered 2013-08-28: 25 mg via ORAL
  Filled 2013-08-28: qty 10

## 2013-08-28 MED ORDER — LIDOCAINE VISCOUS 2 % MT SOLN
15.0000 mL | Freq: Once | OROMUCOSAL | Status: AC
  Administered 2013-08-28: 15 mL via OROMUCOSAL
  Filled 2013-08-28: qty 15

## 2013-08-28 MED ORDER — HYDROCODONE-ACETAMINOPHEN 7.5-325 MG/15ML PO SOLN
15.0000 mL | Freq: Once | ORAL | Status: AC
  Administered 2013-08-28: 15 mL via ORAL
  Filled 2013-08-28: qty 15

## 2013-08-28 MED ORDER — HYDROCODONE-ACETAMINOPHEN 7.5-325 MG/15ML PO SOLN: ORAL | Status: DC

## 2013-08-28 NOTE — ED Notes (Signed)
Patient is alert and oriented x3.  He is complaining of right side neck pain with a soar throat and neck pain He states that this started today and has progressively gotten worse.  Currently he states that the pain is  9 of 10.

## 2013-08-28 NOTE — ED Provider Notes (Signed)
Medical screening examination/treatment/procedure(s) were performed by non-physician practitioner and as supervising physician I was immediately available for consultation/collaboration.  Eldred Lievanos L Juan Olthoff, MD 08/28/13 2254 

## 2013-08-28 NOTE — ED Provider Notes (Signed)
CSN: [id number]     Arrival date & time 08/28/13  1943 History  This chart was scribed for non-physician practitioner Ivonne Andrew, PA-C working with Flint Melter, MD by Danella Maiers, ED Scribe. This patient was seen in room WTR8/WTR8 and the patient's care was started at 8:20 PM.     Chief Complaint  Patient presents with  . Sore Throat   The history is provided by the patient. No language interpreter was used.   HPI Comments: Eddie Rose is a 22 y.o. male who presents to the Emergency Department complaining of headache and sore throat that started this morning and worsened over the last four hours. He reports difficulty swallowing. Pain is severe. He has not taken any medications for the pain. He is a Midwife. Denies fever chills. He denies nausea, vomiting, tooth pain. He is otherwise healthy. He is not on any medications currently. He has no allergies to medications. No other aggravating or alleviating factors. No other associated symptoms.     History reviewed. No pertinent past medical history. History reviewed. No pertinent past surgical history. History reviewed. No pertinent family history. History  Substance Use Topics  . Smoking status: Never Smoker   . Smokeless tobacco: Not on file  . Alcohol Use: No    Review of Systems  Constitutional: Negative for fever, chills and diaphoresis.  HENT: Positive for sore throat. Negative for congestion and dental problem.   Respiratory: Negative for cough.   Gastrointestinal: Negative for nausea and vomiting.  Neurological: Positive for headaches.  All other systems reviewed and are negative.    Allergies  Review of patient's allergies indicates no known allergies.  Home Medications   Current Outpatient Rx  Name  Route  Sig  Dispense  Refill  . HYDROcodone-acetaminophen (NORCO/VICODIN) 5-325 MG per tablet   Oral   Take 1 tablet by mouth every 4 (four) hours as needed for pain.   10 tablet   0    BP  151/87  Pulse 61  Temp(Src) 99.2 F (37.3 C) (Oral)  Resp 14  SpO2 100% Physical Exam  Nursing note and vitals reviewed. Constitutional: He is oriented to person, place, and time. He appears well-developed and well-nourished. No distress.  HENT:  Head: Normocephalic and atraumatic.  Right Ear: Tympanic membrane normal.  Left Ear: Tympanic membrane normal.  Mouth/Throat: Posterior oropharyngeal erythema present. No oropharyngeal exudate, posterior oropharyngeal edema or tonsillar abscesses.  Uvula midline. No signs or PTA.  Eyes: Conjunctivae and EOM are normal.  Neck: Normal range of motion. Neck supple. No tracheal deviation present.  No meningeal signs  Cardiovascular: Normal rate.   Pulmonary/Chest: Effort normal. No respiratory distress. He has no wheezes.  Musculoskeletal: Normal range of motion.  Lymphadenopathy:    He has cervical adenopathy.  Neurological: He is alert and oriented to person, place, and time.  Skin: Skin is warm and dry.  Psychiatric: He has a normal mood and affect. His behavior is normal.    ED Course  Procedures   DIAGNOSTIC STUDIES: Oxygen Saturation is 100% on RA, normal by my interpretation.    COORDINATION OF CARE: 8:39 PM- patient seen and evaluated. He is well-appearing in no acute distress. Does not appear severely ill or toxic. Discussed treatment plan with pt which includes pain meds, benadryl, local anesthetic, and rapid strep test. Pt agrees to plan.    Results for orders placed during the hospital encounter of 08/28/13  RAPID STREP SCREEN      Result Value  Range   Streptococcus, Group A Screen (Direct) NEGATIVE  NEGATIVE    MDM   1. Pharyngitis       I personally performed the services described in this documentation, which was scribed in my presence. The recorded information has been reviewed and is accurate.    Angus Seller, PA-C 08/28/13 2136

## 2013-08-30 LAB — CULTURE, GROUP A STREP

## 2014-02-06 ENCOUNTER — Ambulatory Visit (INDEPENDENT_AMBULATORY_CARE_PROVIDER_SITE_OTHER): Payer: BC Managed Care – PPO | Admitting: Internal Medicine

## 2014-02-06 VITALS — BP 134/80 | HR 56 | Temp 98.4°F | Resp 16 | Ht 66.0 in | Wt 223.0 lb

## 2014-02-06 DIAGNOSIS — R109 Unspecified abdominal pain: Secondary | ICD-10-CM

## 2014-02-06 DIAGNOSIS — R1030 Lower abdominal pain, unspecified: Secondary | ICD-10-CM

## 2014-02-06 DIAGNOSIS — B081 Molluscum contagiosum: Secondary | ICD-10-CM

## 2014-02-06 LAB — POCT URINALYSIS DIPSTICK
Bilirubin, UA: NEGATIVE
Blood, UA: NEGATIVE
Glucose, UA: NEGATIVE
KETONES UA: NEGATIVE
LEUKOCYTES UA: NEGATIVE
Nitrite, UA: NEGATIVE
PH UA: 6
Protein, UA: NEGATIVE
Spec Grav, UA: 1.02
Urobilinogen, UA: 1

## 2014-02-06 LAB — POCT UA - MICROSCOPIC ONLY
CASTS, UR, LPF, POC: NEGATIVE
Crystals, Ur, HPF, POC: NEGATIVE
Epithelial cells, urine per micros: NEGATIVE
Mucus, UA: NEGATIVE
RBC, urine, microscopic: NEGATIVE
WBC, Ur, HPF, POC: NEGATIVE
Yeast, UA: NEGATIVE

## 2014-02-06 MED ORDER — PODOFILOX 0.5 % EX GEL: Freq: Two times a day (BID) | CUTANEOUS | Status: DC

## 2014-02-06 NOTE — Progress Notes (Signed)
This chart was scribed for Ethelda ChickKristi M Smith, MD by Beverly MilchJ Harrison Collins, ED Scribe. This patient was seen in room 14 and the patient's care was started at 5:17 PM.  Subjective:    Patient ID: Eddie Rose, male    DOB: 1991-09-08, 23 y.o.   MRN: [id number]  HPI HPI Comments: Eddie Rose is a 23 y.o. male who presents to the [facility] complaining of waxing and waning discomfort of the left groin that began yesterday. He reports the pain is up under his testicles. He states moving his left testicle affected his pain level. Positioning it a certain way relieves or exacerbates his pain. Pt denies dysuria, penile discharge, back pain, fever, urinary frequency, difficulty getting out of the car.  Only one partner in the last year/no prior STDs  Review of Systems  Genitourinary: Positive for testicular pain.  Musculoskeletal: Positive for myalgias (left groin ).  Skin: Positive for rash.  All other systems reviewed and are negative.      Objective:   Physical Exam  Nursing note and vitals reviewed. Constitutional: He is oriented to person, place, and time. He appears well-developed and well-nourished.  HENT:  Head: Normocephalic and atraumatic.  Eyes: EOM are normal. Pupils are equal, round, and reactive to light.  Neck: Normal range of motion. Neck supple.  Genitourinary:  Mild tenderness in the left testicle without specific focus and no swelling or redness. No perineal pain to palpation and no swelling. Mild tenderness in the left inguinal canal but without herniation. He has small cluster of mollescum on shaft.  Musculoskeletal: Normal range of motion.  FROM of the left hip without pain. Back exam normal  Neurological: He is alert and oriented to person, place, and time. He has normal reflexes.  Skin: Skin is warm and dry.  Psychiatric: He has a normal mood and affect.    Filed Vitals:   02/06/14 1646  BP: 134/80  Pulse: 56  Temp: 98.4 F (36.9 C)  Resp:  16  Height: 5\' 6"  (1.676 m)  Weight: 223 lb (101.152 kg)  SpO2: 100%    Results for orders placed in visit on 02/06/14  POCT URINALYSIS DIPSTICK      Result Value Ref Range   Color, UA yellow     Clarity, UA clear     Glucose, UA neg     Bilirubin, UA neg     Ketones, UA neg     Spec Grav, UA 1.020     Blood, UA neg     pH, UA 6.0     Protein, UA neg     Urobilinogen, UA 1.0     Nitrite, UA neg     Leukocytes, UA Negative    POCT UA - MICROSCOPIC ONLY      Result Value Ref Range   WBC, Ur, HPF, POC Neg     RBC, urine, microscopic Neg     Bacteria, U Microscopic 1+     Mucus, UA Neg     Epithelial cells, urine per micros neg     Crystals, Ur, HPF, POC neg     Casts, Ur, LPF, POC neg     Yeast, UA neg       Assessment & Plan:  DIAGNOSTIC STUDIES: Oxygen Saturation is 100% on RA, normal by my interpretation.    COORDINATION OF CARE: Pt advised of plan for treatment and pt agrees.  Groin pain - Plan: POCT urinalysis dipstick, POCT UA - Microscopic  Only  Reassured that mild muscle strain most likely etiology-followup if any testicular swelling Molluscum contagiosum  Meds ordered this encounter  Medications  . podofilox (CONDYLOX) 0.5 % gel    Sig: Apply topically 2 (two) times daily. For 3 consecutive days each week for up to 4 weeks    Dispense:  3.5 g    Refill:  0

## 2014-02-10 ENCOUNTER — Ambulatory Visit: Payer: BC Managed Care – PPO

## 2014-02-10 ENCOUNTER — Ambulatory Visit (INDEPENDENT_AMBULATORY_CARE_PROVIDER_SITE_OTHER): Payer: BC Managed Care – PPO | Admitting: Family Medicine

## 2014-02-10 VITALS — BP 116/76 | HR 53 | Temp 98.5°F | Resp 16 | Ht 66.0 in | Wt 222.2 lb

## 2014-02-10 DIAGNOSIS — M79609 Pain in unspecified limb: Secondary | ICD-10-CM

## 2014-02-10 DIAGNOSIS — M25561 Pain in right knee: Secondary | ICD-10-CM

## 2014-02-10 DIAGNOSIS — M25569 Pain in unspecified knee: Secondary | ICD-10-CM

## 2014-02-10 MED ORDER — DICLOFENAC SODIUM 75 MG PO TBEC: 75.0000 mg | DELAYED_RELEASE_TABLET | Freq: Two times a day (BID) | ORAL | Status: DC

## 2014-02-10 NOTE — Patient Instructions (Signed)
You have strained her right knee. There is no sign of any of the ligaments are torn where the cartilage has been damage. The bones as well but normal  I think what happened is he got the strain with inflammation which should resolve in a week. If your pain continues or worsens please come back.
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
## 2015-03-29 ENCOUNTER — Ambulatory Visit (INDEPENDENT_AMBULATORY_CARE_PROVIDER_SITE_OTHER): Payer: BC Managed Care – PPO | Admitting: Physician Assistant

## 2015-03-29 VITALS — BP 118/78 | HR 89 | Temp 98.9°F | Resp 20 | Ht 67.0 in | Wt 232.0 lb

## 2015-03-29 DIAGNOSIS — Z0000 Encounter for general adult medical examination without abnormal findings: Secondary | ICD-10-CM | POA: Diagnosis not present

## 2015-03-29 NOTE — Progress Notes (Signed)
   Subjective:    Patient ID: Eddie Rose, male    DOB: [birth year], 24 y.o.   MRN: [id number]  Chief Complaint  Patient presents with  . Annual Exam    Patient will be traveling abroad and needs documentation that he is healthy to travel.    Patient Active Problem List   Diagnosis Date Noted  . Anxiety state, unspecified 05/01/2013   Prior to Admission medications   Medication Sig Start Date End Date Taking? Authorizing Provider  Fexofenadine HCl (ALLEGRA PO) Take by mouth 1 day or 1 dose.   Yes Historical Provider, MD   Medications, allergies, past medical history, surgical history, family history, social history and problem list reviewed and updated.  HPI  [age] yom presents for cpe.   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 The program he is going through requires a signed form stating he is ok to go.   He denies any pmh other than allergies. No medications other than allegra. Exercises regularly without issue. No illicit drug use. Approx alcoholic drink day. He will be in [place] for a 9 month period. His fiancee is currently living there.   Review of Systems No cp, sob, fevers, chills, abd pain, n/v, diarrhea, night sweats.     Objective:   Physical Exam  Constitutional: He is oriented to person, place, and time. He appears well-developed and well-nourished.  Non-toxic appearance. He does not have a sickly appearance. He does not appear ill. No distress.  BP 118/78 mmHg  Pulse 89  Temp(Src) 98.9 F (37.2 C) (Oral)  Resp 20  Ht 5\' 7"  (1.702 m)  Wt 232 lb (105.235 kg)  BMI 36.33 kg/m2  SpO2 98%   HENT:  Right Ear: Tympanic membrane normal.  Left Ear: Tympanic membrane normal.  Nose: Right sinus exhibits no maxillary sinus tenderness and no frontal sinus tenderness. Left sinus exhibits no maxillary sinus tenderness and no frontal sinus tenderness.  Mouth/Throat: Uvula is midline, oropharynx is clear and moist and mucous membranes are  normal.  75% cerumen impaction right ear.   Eyes: Conjunctivae and EOM are normal. Pupils are equal, round, and reactive to light.  Neck: Trachea normal and normal range of motion. No JVD present.  Cardiovascular: Normal rate, regular rhythm and normal heart sounds.  Exam reveals no gallop.   No murmur heard. Pulmonary/Chest: Effort normal and breath sounds normal. No tachypnea.  Abdominal: Soft. Normal appearance and bowel sounds are normal. There is no tenderness.  Neurological: He is alert and oriented to person, place, and time. He has normal strength. No cranial nerve deficit or sensory deficit. He displays a negative Romberg sign. Coordination and gait normal.  Psychiatric: He has a normal mood and affect. His speech is normal and behavior is normal.      Assessment & Plan:   [age] yom presents for cpe.   Annual physical exam --normal exam, vitals, no concerning pmh --form written for ok to travel to [place] --no immunizations required today per pt --debrox for cerumen right ear --rtc as needed  Donnajean Lopesodd M. Chaya Dehaan, PA-C Physician Assistant-Certified [facility]  03/29/2015 5:24 PM

## 2015-10-23 IMAGING — CR DG KNEE COMPLETE 4+V*R*
5 series · 5 of 5 positions shown · non-contrast
Comparison: None.

CLINICAL DATA: Right knee pain.

EXAM:
RIGHT KNEE - COMPLETE 4+ VIEW

[AP]
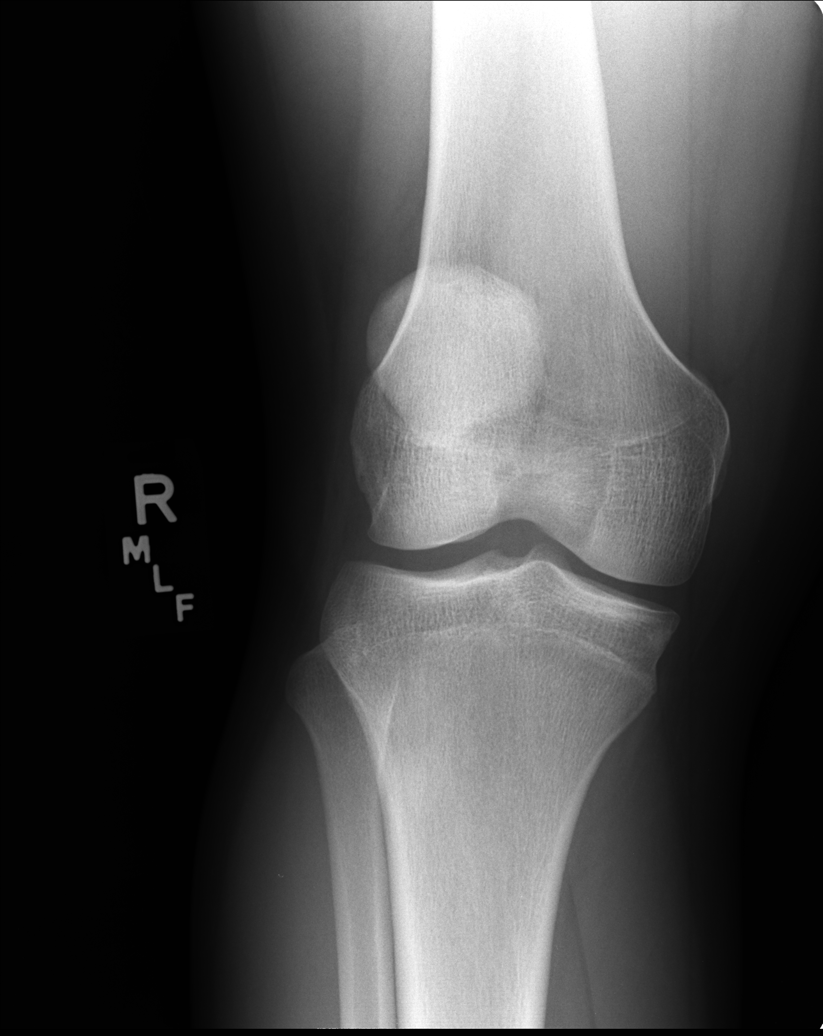

[lateral]
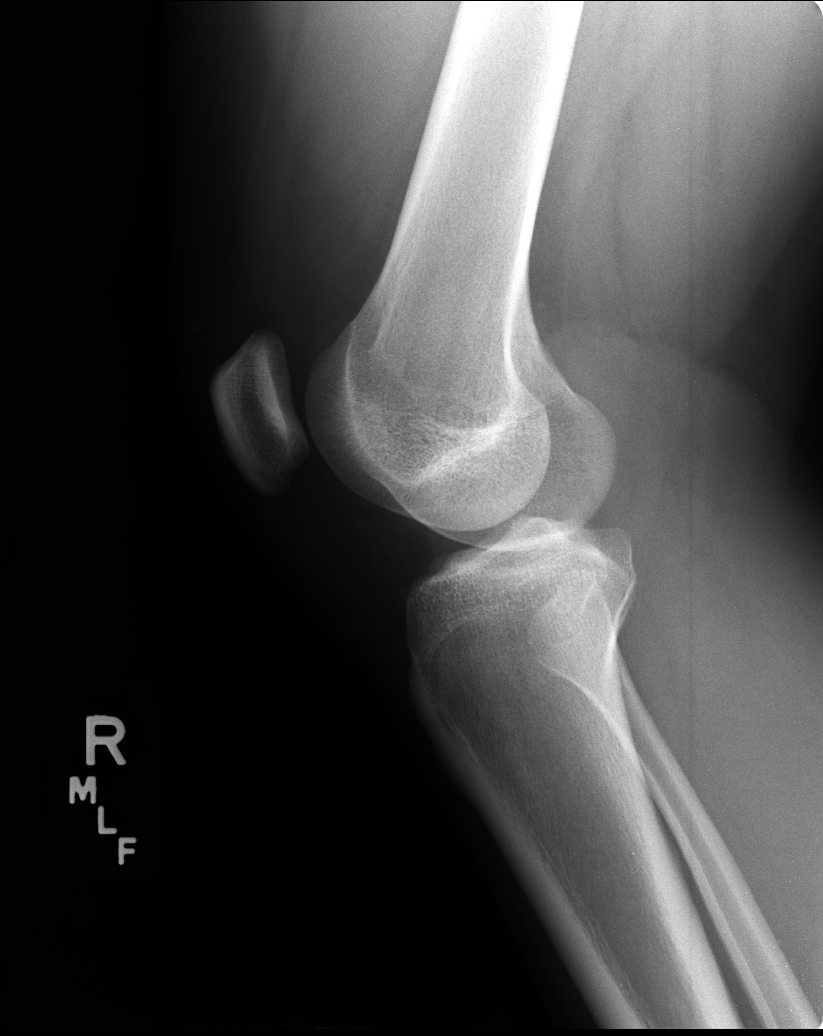

[ap ext rot]
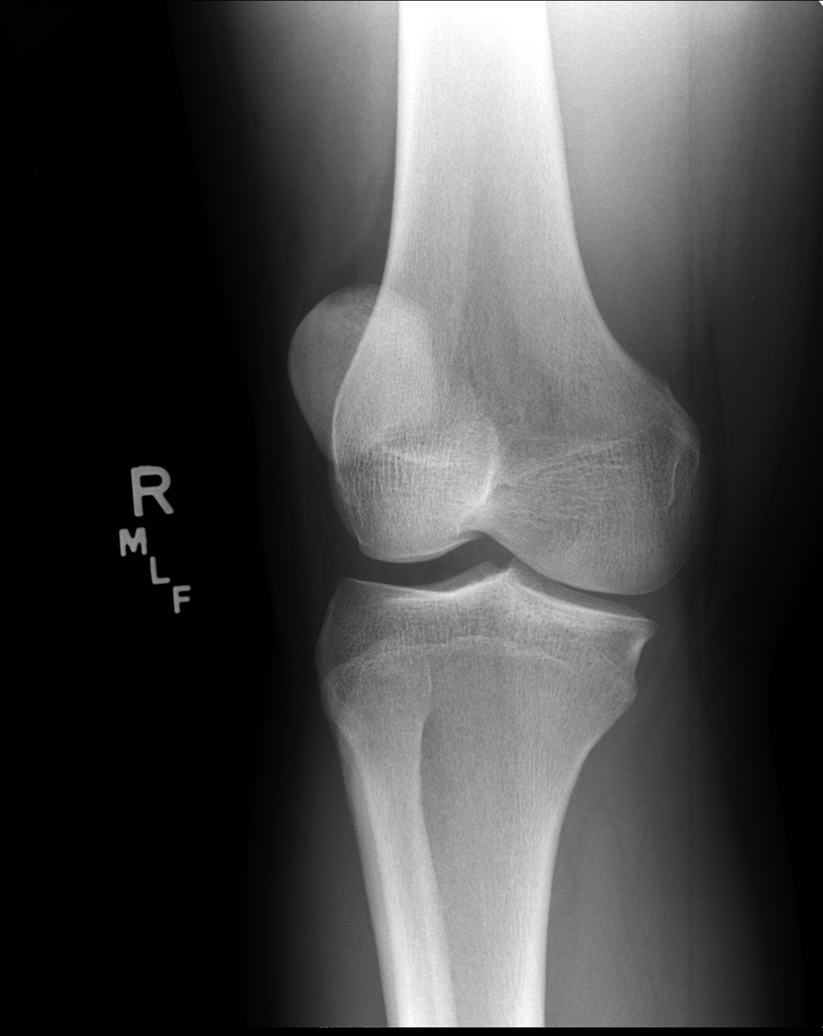

[ap int rot]
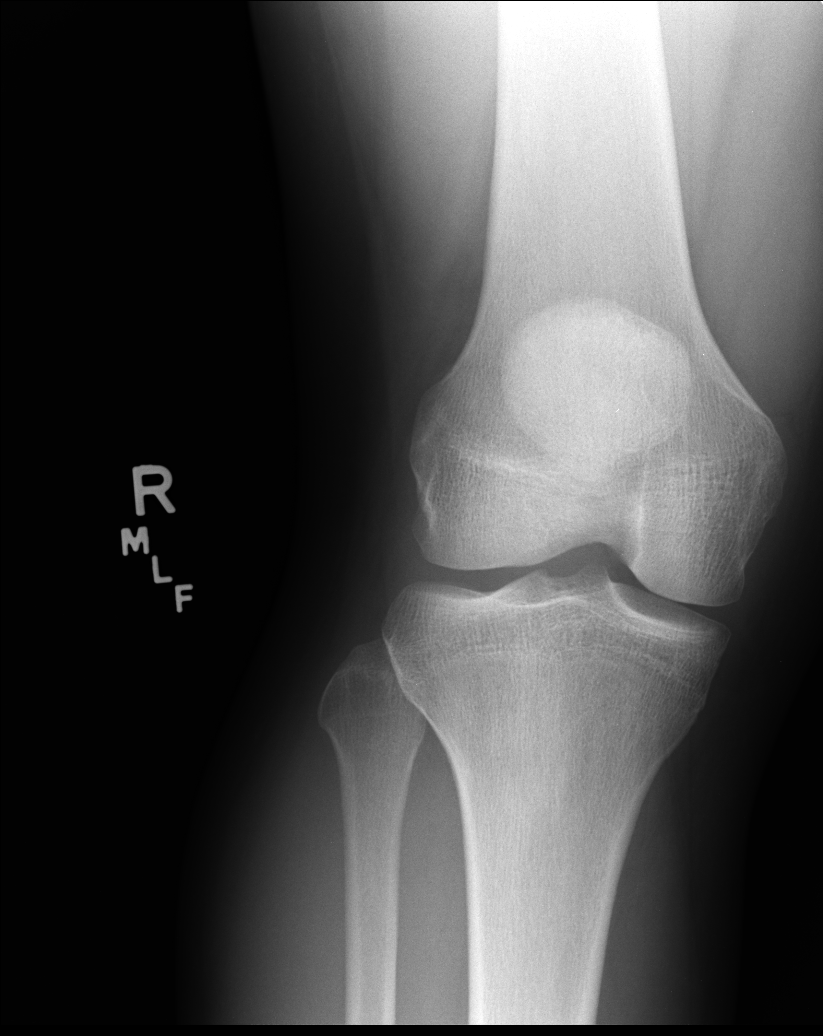

[sunrise]
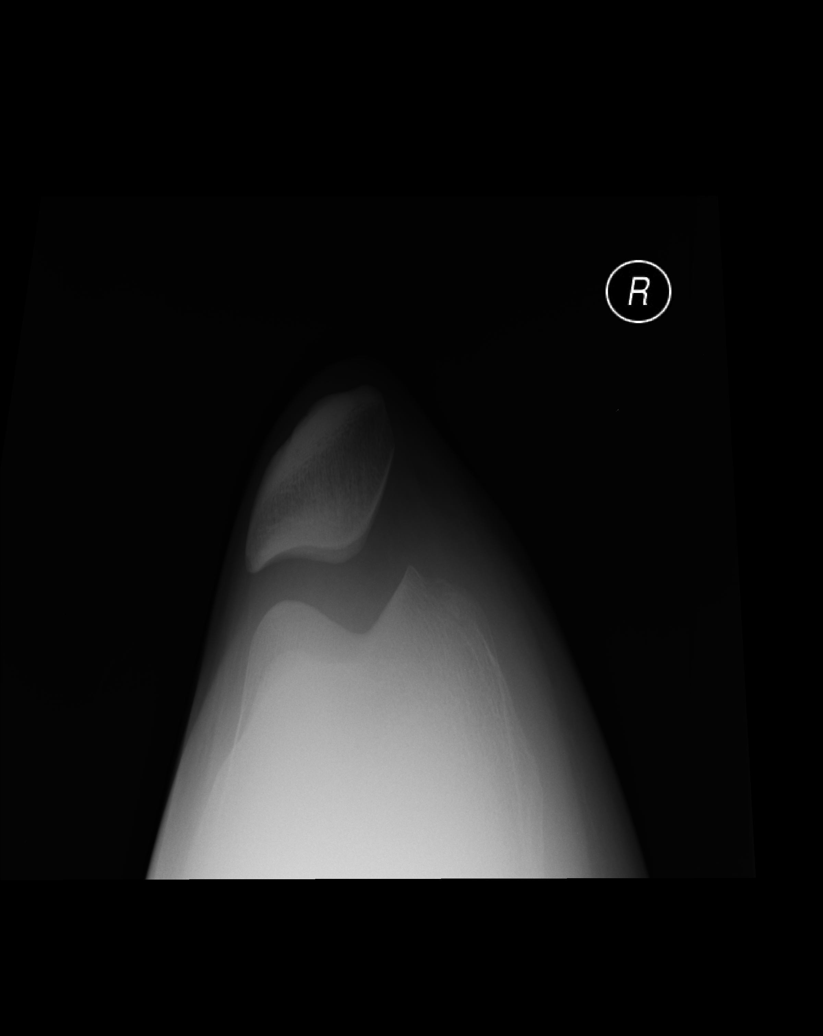

[5 of 5 positions shown; findings below may reference images not displayed]

FINDINGS: There is no evidence of fracture, dislocation, or joint effusion.
There is no evidence of arthropathy or other focal bone abnormality.
Soft tissues are unremarkable.
IMPRESSION: Normal right knee.
